# Patient Record
Sex: Female | Born: 1985 | Race: Black or African American | Hispanic: No | Marital: Single | Smoking: Never smoker
Health system: Southern US, Community
[De-identification: ages and names within clinical notes are randomized; demographics above are authoritative.]

## PROBLEM LIST (undated history)

## (undated) HISTORY — PX: DENTAL RESTORATION/EXTRACTION WITH X-RAY: SHX5796

---

## 2014-11-20 ENCOUNTER — Observation Stay: Payer: Self-pay | Admitting: Obstetrics & Gynecology

## 2014-11-25 ENCOUNTER — Observation Stay: Payer: Self-pay | Admitting: Obstetrics and Gynecology

## 2014-11-25 LAB — PLATELET COUNT: PLATELETS: 138 10*3/uL — AB (ref 150–440)

## 2014-12-03 ENCOUNTER — Inpatient Hospital Stay: Payer: Self-pay | Admitting: Obstetrics and Gynecology

## 2014-12-03 LAB — CBC WITH DIFFERENTIAL/PLATELET
Basophil #: 0 10*3/uL (ref 0.0–0.1)
Basophil %: 0.1 %
Eosinophil #: 0.1 10*3/uL (ref 0.0–0.7)
Eosinophil %: 1.4 %
HCT: 34 % — ABNORMAL LOW (ref 35.0–47.0)
HGB: 11.5 g/dL — ABNORMAL LOW (ref 12.0–16.0)
Lymphocyte #: 1.4 10*3/uL (ref 1.0–3.6)
Lymphocyte %: 16.6 %
MCH: 32.3 pg (ref 26.0–34.0)
MCHC: 34 g/dL (ref 32.0–36.0)
MCV: 95 fL (ref 80–100)
MONOS PCT: 7.5 %
Monocyte #: 0.6 x10 3/mm (ref 0.2–0.9)
Neutrophil #: 6.2 10*3/uL (ref 1.4–6.5)
Neutrophil %: 74.4 %
Platelet: 131 10*3/uL — ABNORMAL LOW (ref 150–440)
RBC: 3.57 10*6/uL — ABNORMAL LOW (ref 3.80–5.20)
RDW: 13.7 % (ref 11.5–14.5)
WBC: 8.3 10*3/uL (ref 3.6–11.0)

## 2014-12-05 LAB — RAPID URINE DRUG SCREEN, HOSP PERFORMED
Amphetamines, Ur Screen: NEGATIVE (ref ?–1000)
Barbiturates, Ur Screen: NEGATIVE (ref ?–200)
Benzodiazepine, Ur Scrn: NEGATIVE (ref ?–200)
CANNABINOID 50 NG, UR ~~LOC~~: NEGATIVE (ref ?–50)
COCAINE METABOLITE, UR ~~LOC~~: NEGATIVE (ref ?–300)
Opiate, Ur Screen: NEGATIVE (ref ?–300)

## 2014-12-06 LAB — HEMATOCRIT: HCT: 28.2 % — ABNORMAL LOW (ref 35.0–47.0)

## 2015-02-11 ENCOUNTER — Emergency Department: Payer: Self-pay | Admitting: Emergency Medicine

## 2015-05-07 NOTE — H&P (Signed)
L&D Evaluation:  History Expanded:  HPI 29 yo at term for Non-Stress Test.  estimated date of confinement either Dec 1 or Nov 17, based on differing information.  Patient seen at [facility] for transfer of care after moving here recently.   Gravida 2   Term 0   PreTerm 0   Abortion 1   Living 0   Patient's Medical History No Chronic Illness   Patient's Surgical History none   Medications Pre Natal Vitamins   Allergies NKDA   Social History none   Family History Non-Contributory   ROS:  ROS All systems were reviewed.  HEENT, CNS, GI, GU, Respiratory, CV, Renal and Musculoskeletal systems were found to be normal.   Exam:  Vital Signs stable   General no apparent distress   Mental Status clear   Abdomen gravid, non-tender   Estimated Fetal Weight Average for gestational age   Edema no edema   FHT normal rate with no decels   Ucx absent   Impression:  Impression reactive NST, Term vs Post Dates, Dates unclear.  Fetal Well-being Reassuring.   Plan:  Comments A NST procedure was performed with FHR monitoring and a normal baseline established, appropriate time of 20-40 minutes of evaluation, and accels >2 seen w 15x15 characteristics.  Results show a REACTIVE Non-Stress Test.   Electronic Signatures: Letitia LibraHarris, Hancel Ion Paul (MD)  (Signed [phone]-Nov-15 17:53)  Authored: L&D Evaluation   Last Updated: 24-Nov-15 17:53 by Letitia LibraHarris, Rondal Vandevelde Paul (MD)
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
## 2015-11-27 ENCOUNTER — Encounter: Payer: Self-pay | Admitting: Obstetrics and Gynecology

## 2015-11-28 ENCOUNTER — Encounter: Payer: Self-pay | Admitting: Obstetrics and Gynecology

## 2018-06-12 ENCOUNTER — Emergency Department: Payer: Medicaid Other

## 2018-06-12 ENCOUNTER — Emergency Department
Admission: EM | Admit: 2018-06-12 | Discharge: 2018-06-13 | Disposition: A | Discharge status: Home / Self Care | Payer: Medicaid Other | Attending: Emergency Medicine | Admitting: Emergency Medicine

## 2018-06-12 DIAGNOSIS — R042 Hemoptysis: Secondary | ICD-10-CM | POA: Insufficient documentation

## 2018-06-12 DIAGNOSIS — R05 Cough: Secondary | ICD-10-CM | POA: Insufficient documentation

## 2018-06-12 DIAGNOSIS — J40 Bronchitis, not specified as acute or chronic: Secondary | ICD-10-CM

## 2018-06-12 DIAGNOSIS — M7989 Other specified soft tissue disorders: Secondary | ICD-10-CM

## 2018-06-12 DIAGNOSIS — R059 Cough, unspecified: Secondary | ICD-10-CM

## 2018-06-12 DIAGNOSIS — R2242 Localized swelling, mass and lump, left lower limb: Secondary | ICD-10-CM | POA: Insufficient documentation

## 2018-06-12 LAB — COMPREHENSIVE METABOLIC PANEL
ALK PHOS: 90 U/L (ref 38–126)
ALT: 18 U/L (ref 14–54)
AST: 26 U/L (ref 15–41)
Albumin: 3.7 g/dL (ref 3.5–5.0)
Anion gap: 8 (ref 5–15)
BILIRUBIN TOTAL: 0.3 mg/dL (ref 0.3–1.2)
BUN: 7 mg/dL (ref 6–20)
CALCIUM: 8.6 mg/dL — AB (ref 8.9–10.3)
CO2: 21 mmol/L — ABNORMAL LOW (ref 22–32)
Chloride: 108 mmol/L (ref 101–111)
Creatinine, Ser: 0.73 mg/dL (ref 0.44–1.00)
GFR calc Af Amer: >60 mL/min (ref >60)
GFR calc non Af Amer: >60 mL/min (ref >60)
Glucose, Bld: 106 mg/dL — ABNORMAL HIGH (ref 65–99)
POTASSIUM: 3.6 mmol/L (ref 3.5–5.1)
Sodium: 137 mmol/L (ref 135–145)
TOTAL PROTEIN: 7.5 g/dL (ref 6.5–8.1)

## 2018-06-12 LAB — CBC WITH DIFFERENTIAL/PLATELET
BASOS ABS: 0 10*3/uL (ref 0–0.1)
Basophils Relative: 0 %
Eosinophils Absolute: 0.2 10*3/uL (ref 0–0.7)
Eosinophils Relative: 3 %
HEMATOCRIT: 33.2 % — AB (ref 35.0–47.0)
HEMOGLOBIN: 10.8 g/dL — AB (ref 12.0–16.0)
LYMPHS PCT: 32 %
Lymphs Abs: 1.8 10*3/uL (ref 1.0–3.6)
MCH: 27.2 pg (ref 26.0–34.0)
MCHC: 32.6 g/dL (ref 32.0–36.0)
MCV: 83.5 fL (ref 80.0–100.0)
MONOS PCT: 7 %
Monocytes Absolute: 0.4 10*3/uL (ref 0.2–0.9)
NEUTROS ABS: 3.1 10*3/uL (ref 1.4–6.5)
Neutrophils Relative %: 58 %
Platelets: 224 10*3/uL (ref 150–440)
RBC: 3.98 MIL/uL (ref 3.80–5.20)
RDW: 15.3 % — AB (ref 11.5–14.5)
WBC: 5.4 10*3/uL (ref 3.6–11.0)

## 2018-06-12 LAB — FIBRIN DERIVATIVES D-DIMER (ARMC ONLY): Fibrin derivatives D-dimer (ARMC): 991.5 ng/mL (FEU) — ABNORMAL HIGH (ref 0.00–499.00)

## 2018-06-12 MED ORDER — PREDNISONE 20 MG PO TABS: 60.0000 mg | ORAL_TABLET | Freq: Every day | ORAL | 0 refills | Status: AC

## 2018-06-12 MED ORDER — ALBUTEROL SULFATE HFA 108 (90 BASE) MCG/ACT IN AERS: 2.0000 | INHALATION_SPRAY | Freq: Four times a day (QID) | RESPIRATORY_TRACT | 2 refills | Status: AC | PRN

## 2018-06-12 MED ORDER — IOPAMIDOL (ISOVUE-370) INJECTION 76%
75.0000 mL | Freq: Once | INTRAVENOUS | Status: AC | PRN
  Administered 2018-06-12: 75 mL via INTRAVENOUS

## 2018-06-12 NOTE — ED Triage Notes (Signed)
Patient c/o hemoptysis and congestion. Patient's voice is hoarse.

## 2018-06-12 NOTE — ED Provider Notes (Signed)
[facility] Emergency Department Provider Note  ____________________________________________  Time seen: Approximately 10:42 PM  I have reviewed the triage vital signs and the nursing notes.   HISTORY  Chief Complaint Hemoptysis   HPI Molly Dunn is a 32 y.o. female no significant past medical history who presents for evaluation of cough.  Patient reports that her symptoms started a month ago.  Initially she had mild hemoptysis which she describes as blood streaking in her sputum.  That happened one time a month ago.  She saw a doctor at that time who told her was probably allergies.  She continues to have persistent coughing which is productive of yellow sputum.  No further episodes of hemoptysis.  She denies chest pain or shortness of breath.  She reports sometimes the coughing so bad that she has a hard time catching her breath.  She denies any personal or family history of blood clots, recent travel immobilization, exogenous hormones.  She does notice intermittent left lower extremity pain especially when she stands for several hours at work.  No pain at this time.  She denies any fever or chills.  No prior history of smoking.  PMH None - reviewed  Past Surgical History:  Procedure Laterality Date  . DENTAL RESTORATION/EXTRACTION WITH X-RAY      Prior to Admission medications   Medication Sig Start Date End Date Taking? Authorizing Provider  predniSONE (DELTASONE) 20 MG tablet Take 3 tablets (60 mg total) by mouth daily for 5 days. 06/12/18 06/17/18  Nita Sickle, MD    Allergies Patient has no known allergies.  FH No f/h of DVT/ PE  Social History Social History   Tobacco Use  . Smoking status: Never Smoker  . Smokeless tobacco: Never Used  Substance Use Topics  . Alcohol use: Not Currently  . Drug use: Not on file    Review of Systems  Constitutional: Negative for fever. Eyes: Negative for visual changes. ENT: Negative for  sore throat. Neck: No neck pain  Cardiovascular: Negative for chest pain. Respiratory: Negative for shortness of breath. + cough and hemoptysis Gastrointestinal: Negative for abdominal pain, vomiting or diarrhea. Genitourinary: Negative for dysuria. Musculoskeletal: Negative for back pain. + Left leg pain Skin: Negative for rash. Neurological: Negative for headaches, weakness or numbness. Psych: No SI or HI  ____________________________________________   PHYSICAL EXAM:  VITAL SIGNS: ED Triage Vitals  Enc Vitals Group     BP 06/12/18 1952 122/67     Pulse Rate 06/12/18 1952 (!) 107     Resp 06/12/18 1952 19     Temp 06/12/18 1952 98.7 F (37.1 C)     Temp Source 06/12/18 1952 Oral     SpO2 06/12/18 1952 100 %     Weight 06/12/18 1951 210 lb 5.1 oz (95.4 kg)     Height --      Head Circumference --      Peak Flow --      Pain Score 06/12/18 1952 0     Pain Loc --      Pain Edu? --      Excl. in GC? --     Constitutional: Alert and oriented. Well appearing and in no apparent distress. HEENT:      Head: Normocephalic and atraumatic.         Eyes: Conjunctivae are normal. Sclera is non-icteric.       Mouth/Throat: Mucous membranes are moist.       Neck: Supple with no signs  of meningismus. Cardiovascular: Tachycardic with regular. No murmurs, gallops, or rubs. 2+ symmetrical distal pulses are present in all extremities. No JVD. Respiratory: Normal respiratory effort. Lungs are clear to auscultation bilaterally. No wheezes, crackles, or rhonchi.  Gastrointestinal: Soft, non tender, and non distended with positive bowel sounds. No rebound or guarding. Musculoskeletal: Asymmetric swelling of the left lower extremity, no pitting edema, no erythema or warmth neurologic: Normal speech and language. Face is symmetric. Moving all extremities. No gross focal neurologic deficits are appreciated. Skin: Skin is warm, dry and intact. No rash noted. Psychiatric: Mood and affect are  normal. Speech and behavior are normal.  ____________________________________________   LABS (all labs ordered are listed, but only abnormal results are displayed)  Labs Reviewed  CBC WITH DIFFERENTIAL/PLATELET - Abnormal; Notable for the following components:      Result Value   Hemoglobin 10.8 (*)    HCT 33.2 (*)    RDW 15.3 (*)    All other components within normal limits  COMPREHENSIVE METABOLIC PANEL - Abnormal; Notable for the following components:   CO2 21 (*)    Glucose, Bld 106 (*)    Calcium 8.6 (*)    All other components within normal limits  FIBRIN DERIVATIVES D-DIMER ([facility] ONLY) - Abnormal; Notable for the following components:   Fibrin derivatives D-dimer [facility]) 991.50 (*)    All other components within normal limits   ____________________________________________  EKG  none  ____________________________________________  RADIOLOGY  I have personally reviewed the images performed during this visit and I agree with the Radiologist's read.   Interpretation by Radiologist:  Dg Chest 2 View  Result Date: 06/12/2018 CLINICAL DATA:  Hemoptysis and congestion.  Cough for a while. EXAM: CHEST - 2 VIEW COMPARISON:  None. FINDINGS: Shallow inspiration. Vascular crowding in the right lung base. Heart size and pulmonary vascularity are normal. Lungs are clear and expanded. No blunting of costophrenic angles. No pneumothorax. Mediastinal contours appear intact. IMPRESSION: Shallow inspiration.  No evidence of active pulmonary disease. Electronically Signed   By: Burman Nieves M.D.   On: 06/12/2018 21:29     ____________________________________________   PROCEDURES  Procedure(s) performed: None Procedures Critical Care performed:  None ____________________________________________   INITIAL IMPRESSION / ASSESSMENT AND PLAN / ED COURSE  32 y.o. female no significant past medical history who presents for evaluation of cough x 30 days.  One episode of hemoptysis.   Patient does have asymmetric swelling of her left lower extremity with no evidence of cellulitis or pitting edema.  Therefore a d-dimer was sent which is positive.  Venous ultrasound of the left lower extremity and CT angiogram of the chest are pending to rule out PE.  Chest x-ray shows no evidence of pneumonia.  Patient with no prior history of smoking. If CTA negative will treat for bronchitis with albuterol and prednisone.    _________________________ 11:02 PM on 06/12/2018 -----------------------------------------  CTA and doppler US pending. Care transferred to Dr. Zenda Alpers   As part of my medical decision making, I reviewed the following data within the electronic MEDICAL RECORD NUMBER Nursing notes reviewed and incorporated, Labs reviewed , Radiograph reviewed , Notes from prior ED visits and [place] Controlled Substance Database    Pertinent labs & imaging results that were available during my care of the patient were reviewed by me and considered in my medical decision making (see chart for details).    ____________________________________________   FINAL CLINICAL IMPRESSION(S) / ED DIAGNOSES  Final diagnoses:  Cough  Hemoptysis  Left leg swelling  Bronchitis      NEW MEDICATIONS STARTED DURING THIS VISIT:  ED Discharge Orders        Ordered    predniSONE (DELTASONE) 20 MG tablet  Daily     06/12/18 2303       Note:  This document was prepared using Dragon voice recognition software and may include unintentional dictation errors.    Nita SickleVeronese, [place], MD 06/12/18 [phone]

## 2018-06-12 NOTE — ED Notes (Addendum)
Patient transported to Radiology (US, CT) for scans

## 2018-06-13 NOTE — ED Provider Notes (Signed)
-----------------------------------------   12:48 AM on 06/13/2018 -----------------------------------------   Blood pressure 100/85, pulse 90, temperature 98.7 F (37.1 C), temperature source Oral, resp. rate 16, weight 95.4 kg (210 lb 5.1 oz), last menstrual period 05/26/2018, SpO2 100 %.  Assuming care from Dr. Don PerkingVeronese.  In short, Molly Dunn is a 32 y.o. female with a chief complaint of Hemoptysis .  Refer to the original H&P for additional details.  The current plan of care is to follow up the result of the CT angio chest and lower extremity ultrasound.   Left lower extremity venous Doppler ultrasound: No evidence of deep venous thrombosis in the left lower extremity  CT angios chest PE: No pulmonary embolism, no acute pulmonary parenchymal findings.   The patient does not have a PE or DVT.  She will be discharged home with a diagnosis of bronchitis.      Rebecka ApleyWebster, Molly P, MD 06/13/18 [phone]

## 2018-06-13 NOTE — Discharge Instructions (Addendum)
Please follow-up with the acute care clinic for further evaluation of your symptoms.  Please return with any worsening condition or any other concerns.

## 2019-02-01 ENCOUNTER — Other Ambulatory Visit: Payer: Self-pay

## 2019-02-01 ENCOUNTER — Encounter: Payer: Self-pay | Admitting: Emergency Medicine

## 2019-02-01 ENCOUNTER — Ambulatory Visit
Admission: EM | Admit: 2019-02-01 | Discharge: 2019-02-01 | Disposition: A | Discharge status: Home / Self Care | Payer: Medicaid Other | Attending: Family Medicine | Admitting: Family Medicine

## 2019-02-01 DIAGNOSIS — D508 Other iron deficiency anemias: Secondary | ICD-10-CM | POA: Diagnosis present

## 2019-02-01 LAB — BASIC METABOLIC PANEL
ANION GAP: 8 (ref 5–15)
BUN: 14 mg/dL (ref 6–20)
CHLORIDE: 108 mmol/L (ref 98–111)
CO2: 21 mmol/L — ABNORMAL LOW (ref 22–32)
Calcium: 8.9 mg/dL (ref 8.9–10.3)
Creatinine, Ser: 0.66 mg/dL (ref 0.44–1.00)
GFR calc Af Amer: >60 mL/min (ref >60)
Glucose, Bld: 93 mg/dL (ref 70–99)
POTASSIUM: 3.7 mmol/L (ref 3.5–5.1)
SODIUM: 137 mmol/L (ref 135–145)

## 2019-02-01 LAB — CBC WITH DIFFERENTIAL/PLATELET
ABS IMMATURE GRANULOCYTES: 0.01 10*3/uL (ref 0.00–0.07)
Basophils Absolute: 0 10*3/uL (ref 0.0–0.1)
Basophils Relative: 0 %
Eosinophils Absolute: 0.1 10*3/uL (ref 0.0–0.5)
Eosinophils Relative: 1 %
HCT: 36 % (ref 36.0–46.0)
HEMOGLOBIN: 11.2 g/dL — AB (ref 12.0–15.0)
IMMATURE GRANULOCYTES: 0 %
LYMPHS PCT: 39 %
Lymphs Abs: 2.2 10*3/uL (ref 0.7–4.0)
MCH: 26.3 pg (ref 26.0–34.0)
MCHC: 31.1 g/dL (ref 30.0–36.0)
MCV: 84.5 fL (ref 80.0–100.0)
MONOS PCT: 6 %
Monocytes Absolute: 0.3 10*3/uL (ref 0.1–1.0)
NEUTROS ABS: 2.9 10*3/uL (ref 1.7–7.7)
NEUTROS PCT: 54 %
PLATELETS: 247 10*3/uL (ref 150–400)
RBC: 4.26 MIL/uL (ref 3.87–5.11)
RDW: 15.7 % — ABNORMAL HIGH (ref 11.5–15.5)
WBC: 5.5 10*3/uL (ref 4.0–10.5)
nRBC: 0 % (ref 0.0–0.2)

## 2019-02-01 MED ORDER — FERROUS SULFATE 325 (65 FE) MG PO TABS: 325.0000 mg | ORAL_TABLET | Freq: Two times a day (BID) | ORAL | 0 refills | Status: AC

## 2019-02-01 NOTE — ED Triage Notes (Signed)
Patient states she has been feeling dizzy since Sunday.  Patient has tried rest but is still feeling dizzy

## 2019-02-01 NOTE — Discharge Instructions (Signed)
Follow up with primary care provider 

## 2019-04-29 NOTE — ED Provider Notes (Signed)
[facility]    CSN: [id number] Arrival date & time: 02/01/19  1158     History   Chief Complaint Chief Complaint  Patient presents with  . Dizziness    HPI Molly Dunn is a 33 y.o. female.   33 yo female with a c/o intermittent dizziness for the past 4 days. Denies any fevers, chills, syncope, chest pains, palpitations, numbness/tingling, weakness.  The history is provided by the patient.  Dizziness    History reviewed. No pertinent past medical history.  There are no active problems to display for this patient.   Past Surgical History:  Procedure Laterality Date  . DENTAL RESTORATION/EXTRACTION WITH X-RAY      OB History   No obstetric history on file.      Home Medications    Prior to Admission medications   Medication Sig Start Date End Date Taking? Authorizing Provider  albuterol (PROVENTIL HFA;VENTOLIN HFA) 108 (90 Base) MCG/ACT inhaler Inhale 2 puffs into the lungs every 6 (six) hours as needed for wheezing or shortness of breath. 06/12/18   Nita Sickle, MD  ferrous sulfate 325 (65 FE) MG tablet Take 1 tablet (325 mg total) by mouth 2 (two) times daily with a meal. 02/01/19   Payton Mccallum, MD    Family History Family History  Family history unknown: Yes    Social History Social History   Tobacco Use  . Smoking status: Never Smoker  . Smokeless tobacco: Never Used  Substance Use Topics  . Alcohol use: Not Currently  . Drug use: Not on file     Allergies   Patient has no known allergies.   Review of Systems Review of Systems  Neurological: Positive for dizziness.     Physical Exam Triage Vital Signs ED Triage Vitals  Enc Vitals Group     BP 02/01/19 1221 109/74     Pulse Rate 02/01/19 1221 72     Resp 02/01/19 1221 16     Temp 02/01/19 1221 98.1 F (36.7 C)     Temp src --      SpO2 02/01/19 1221 100 %     Weight 02/01/19 1215 212 lb (96.2 kg)     Height 02/01/19 1215 5\' 6"  (1.676 m)     Head  Circumference --      Peak Flow --      Pain Score 02/01/19 1215 0     Pain Loc --      Pain Edu? --      Excl. in GC? --    No data found.  Updated Vital Signs BP 109/74   Pulse 72   Temp 98.1 F (36.7 C)   Resp 16   Ht 5\' 6"  (1.676 m)   Wt 96.2 kg   LMP 01/12/2019 (Within Weeks)   SpO2 100%   BMI 34.22 kg/m   Visual Acuity Right Eye Distance:   Left Eye Distance:   Bilateral Distance:    Right Eye Near:   Left Eye Near:    Bilateral Near:     Physical Exam Vitals signs reviewed.  Constitutional:      General: She is not in acute distress.    Appearance: She is not toxic-appearing or diaphoretic.  Cardiovascular:     Rate and Rhythm: Normal rate.     Pulses: Normal pulses.     Heart sounds: Normal heart sounds.  Pulmonary:     Effort: Pulmonary effort is normal. No respiratory distress.     Breath  sounds: Normal breath sounds. No stridor. No wheezing, rhonchi or rales.  Neurological:     Mental Status: She is alert.      UC Treatments / Results  Labs (all labs ordered are listed, but only abnormal results are displayed) Labs Reviewed  CBC WITH DIFFERENTIAL/PLATELET - Abnormal; Notable for the following components:      Result Value   Hemoglobin 11.2 (*)    RDW 15.7 (*)    All other components within normal limits  BASIC METABOLIC PANEL - Abnormal; Notable for the following components:   CO2 21 (*)    All other components within normal limits    EKG None  Radiology No results found.  Procedures Procedures (including critical care time)  Medications Ordered in UC Medications - No data to display  Initial Impression / Assessment and Plan / UC Course  I have reviewed the triage vital signs and the nursing notes.  Pertinent labs & imaging results that were available during my care of the patient were reviewed by me and considered in my medical decision making (see chart for details).      Final Clinical Impressions(s) / UC Diagnoses    Final diagnoses:  Other iron deficiency anemia     Discharge Instructions     Follow up with primary care provider    ED Prescriptions    Medication Sig Dispense Auth. Provider   ferrous sulfate 325 (65 FE) MG tablet Take 1 tablet (325 mg total) by mouth 2 (two) times daily with a meal. 30 tablet Payton Mccallumonty, Abby Tucholski, MD     1. Lab results and diagnosis reviewed with patient 2. rx as per orders above; reviewed possible side effects, interactions, risks and benefits  3.  Follow up with pcp  4. Follow-up prn  Controlled Substance Prescriptions [place] Controlled Substance Registry consulted? Not Applicable   Payton Mccallumonty, Whitaker Holderman, MD 04/29/19 1224

## 2019-08-04 IMAGING — US US EXTREM LOW VENOUS*L*
1 series · 13 of 24 positions shown · non-contrast
Comparison: None.

CLINICAL DATA: Asymmetric leg swelling.



[Series 1: us extrem low venous*left* · 0.09mm/px · 13 of 40 slices shown]
[im 1/40]
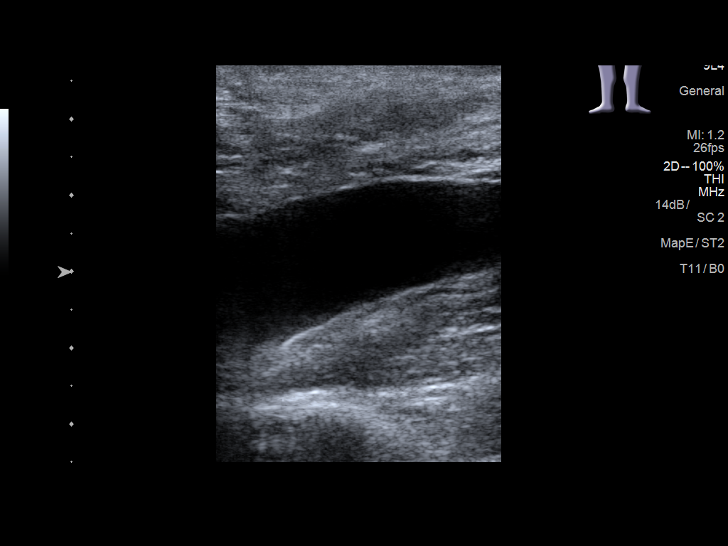
[im 4/40]
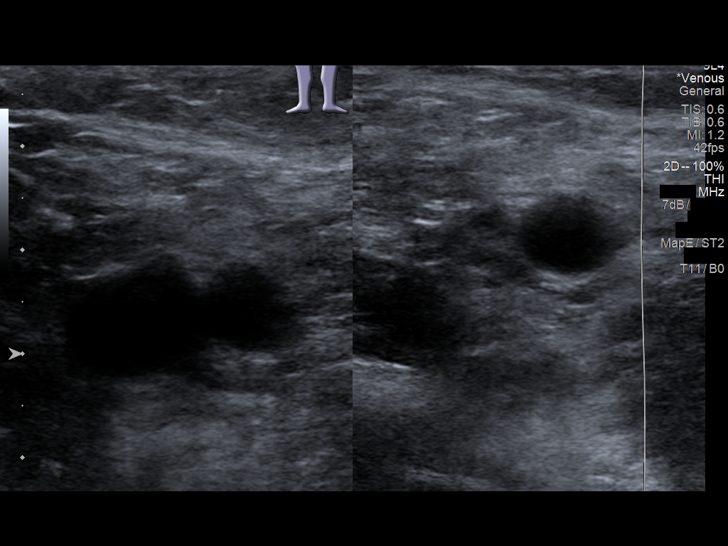
[im 7/40]
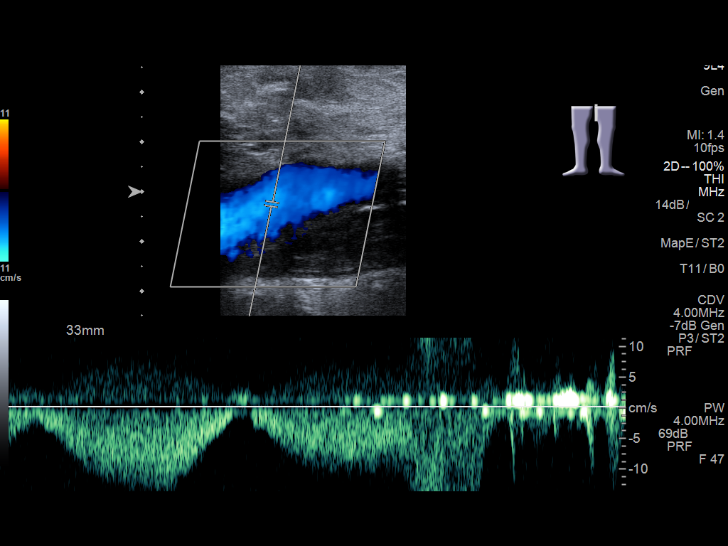
[im 11/40]
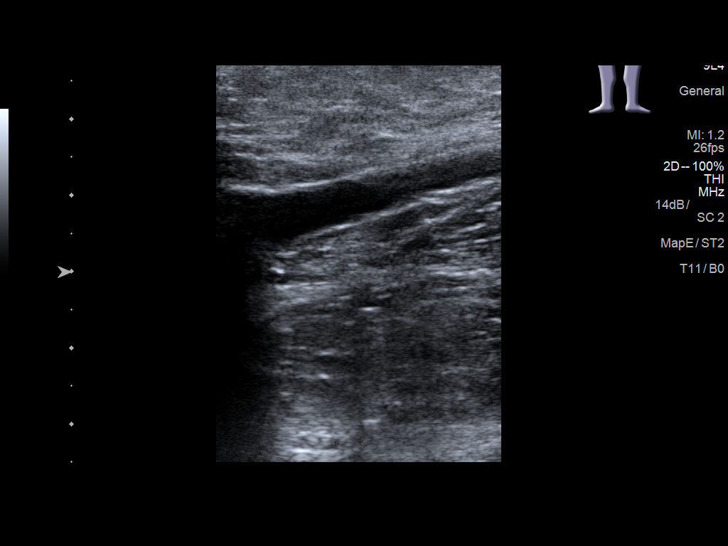
[im 14/40]
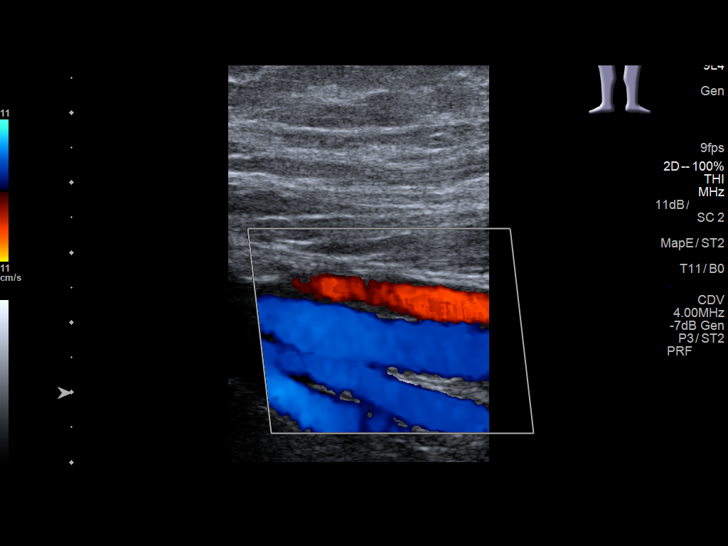
[im 17/40]
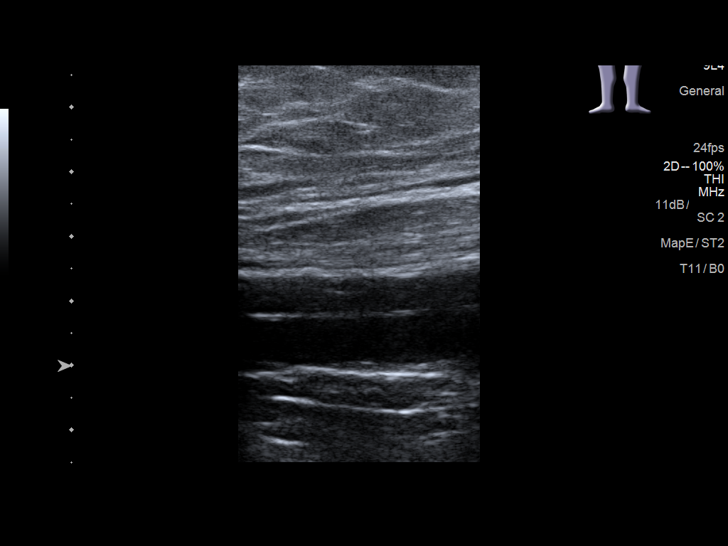
[im 21/40]
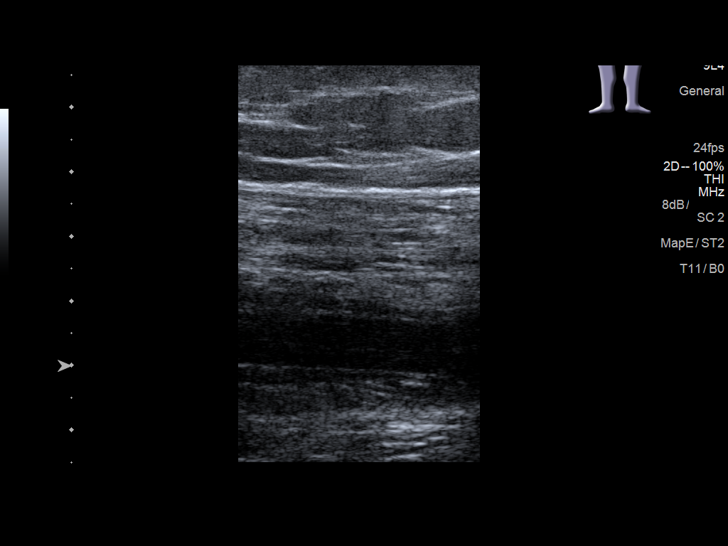
[im 23/40]
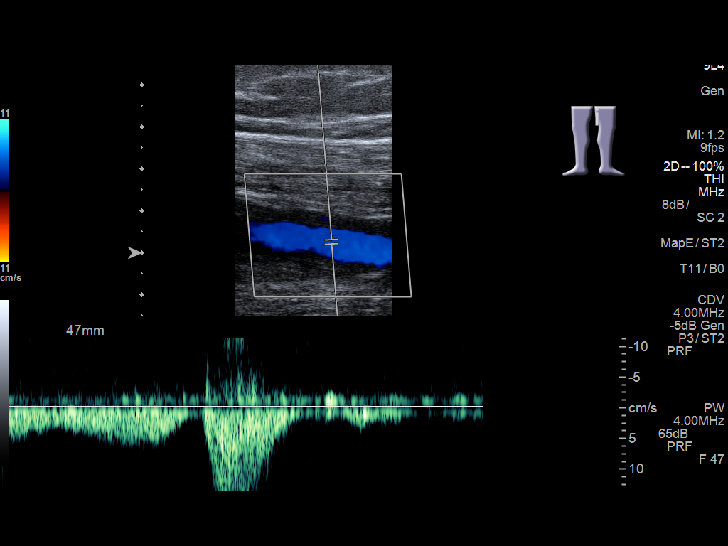
[im 26/40]
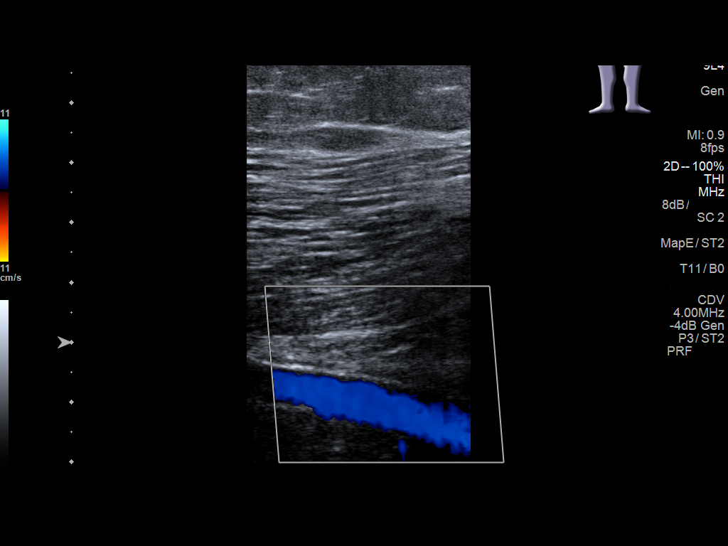
[im 29/40]
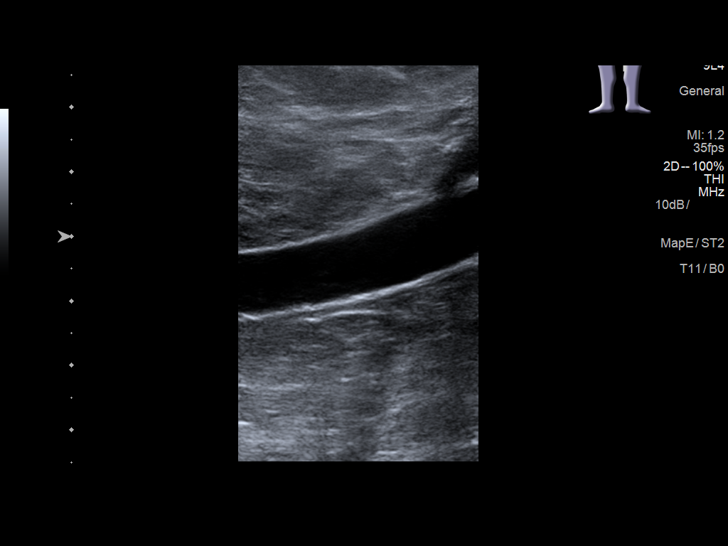
[im 33/40]
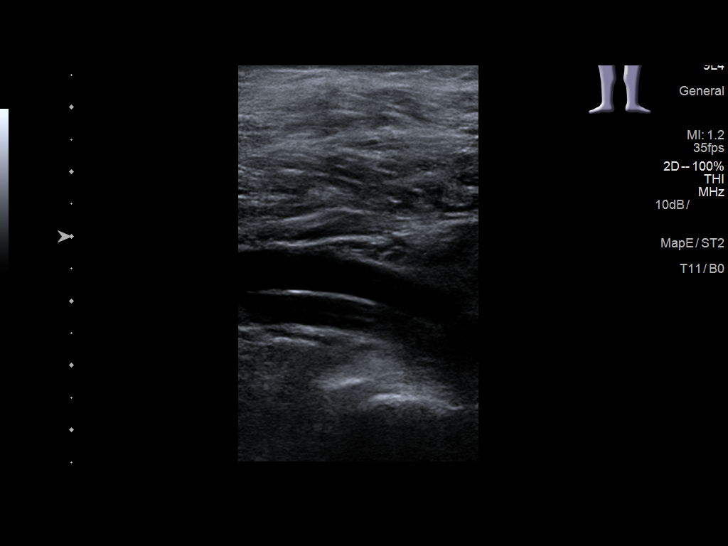
[im 36/40]
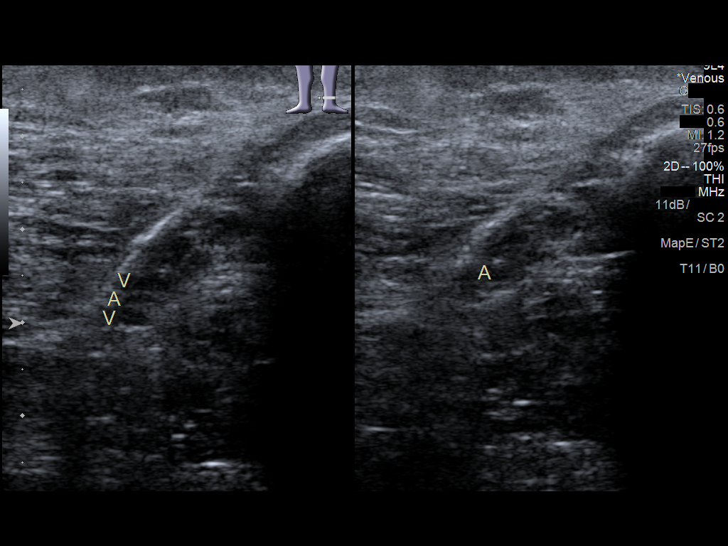
[im 40/40]
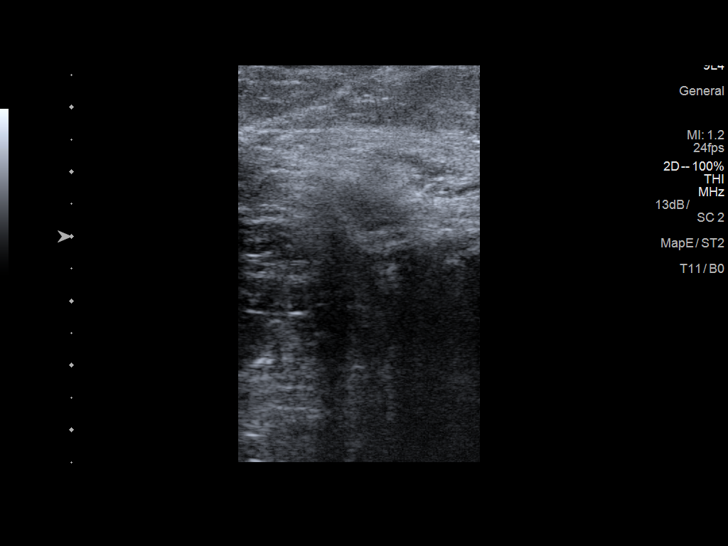

[13 of 24 positions shown; findings below may reference images not displayed]

FINDINGS: Contralateral Common Femoral Vein: Respiratory phasicity is normal
and symmetric with the symptomatic side. No evidence of thrombus.
Normal compressibility.

Common Femoral Vein: No evidence of thrombus. Normal
compressibility, respiratory phasicity and response to augmentation.

Saphenofemoral Junction: No evidence of thrombus. Normal
compressibility and flow on color Doppler imaging.

Profunda Femoral Vein: No evidence of thrombus. Normal
compressibility and flow on color Doppler imaging.

Femoral Vein: No evidence of thrombus. Normal compressibility,
respiratory phasicity and response to augmentation.

Popliteal Vein: No evidence of thrombus. Normal compressibility,
respiratory phasicity and response to augmentation.

Calf Veins: No evidence of thrombus. Normal compressibility and flow
on color Doppler imaging.

Superficial Great Saphenous Vein: No evidence of thrombus. Normal
compressibility.

Venous Reflux:  None.

Other Findings:  None.
IMPRESSION: No evidence of deep venous thrombosis in the left lower extremity.

## 2021-06-22 ENCOUNTER — Emergency Department
Admission: EM | Admit: 2021-06-22 | Discharge: 2021-06-22 | Disposition: A | Discharge status: Home / Self Care | Payer: Medicaid Other | Attending: Emergency Medicine | Admitting: Emergency Medicine

## 2021-06-22 ENCOUNTER — Other Ambulatory Visit: Payer: Self-pay

## 2021-06-22 DIAGNOSIS — R42 Dizziness and giddiness: Secondary | ICD-10-CM | POA: Insufficient documentation

## 2021-06-22 LAB — CBC WITH DIFFERENTIAL/PLATELET
Abs Immature Granulocytes: 0.01 10*3/uL (ref 0.00–0.07)
Basophils Absolute: 0 10*3/uL (ref 0.0–0.1)
Basophils Relative: 0 %
Eosinophils Absolute: 0.2 10*3/uL (ref 0.0–0.5)
Eosinophils Relative: 3 %
HCT: 35.9 % — ABNORMAL LOW (ref 36.0–46.0)
Hemoglobin: 12 g/dL (ref 12.0–15.0)
Immature Granulocytes: 0 %
Lymphocytes Relative: 40 %
Lymphs Abs: 2.2 10*3/uL (ref 0.7–4.0)
MCH: 31.2 pg (ref 26.0–34.0)
MCHC: 33.4 g/dL (ref 30.0–36.0)
MCV: 93.2 fL (ref 80.0–100.0)
Monocytes Absolute: 0.4 10*3/uL (ref 0.1–1.0)
Monocytes Relative: 6 %
Neutro Abs: 2.7 10*3/uL (ref 1.7–7.7)
Neutrophils Relative %: 51 %
Platelets: 178 10*3/uL (ref 150–400)
RBC: 3.85 MIL/uL — ABNORMAL LOW (ref 3.87–5.11)
RDW: 12.5 % (ref 11.5–15.5)
WBC: 5.4 10*3/uL (ref 4.0–10.5)
nRBC: 0 % (ref 0.0–0.2)

## 2021-06-22 LAB — COMPREHENSIVE METABOLIC PANEL
ALT: 15 U/L (ref 0–44)
AST: 20 U/L (ref 15–41)
Albumin: 3.8 g/dL (ref 3.5–5.0)
Alkaline Phosphatase: 55 U/L (ref 38–126)
Anion gap: 5 (ref 5–15)
BUN: 10 mg/dL (ref 6–20)
CO2: 23 mmol/L (ref 22–32)
Calcium: 8.9 mg/dL (ref 8.9–10.3)
Chloride: 108 mmol/L (ref 98–111)
Creatinine, Ser: 0.73 mg/dL (ref 0.44–1.00)
GFR, Estimated: >60 mL/min (ref >60)
Glucose, Bld: 100 mg/dL — ABNORMAL HIGH (ref 70–99)
Potassium: 3.7 mmol/L (ref 3.5–5.1)
Sodium: 136 mmol/L (ref 135–145)
Total Bilirubin: 0.5 mg/dL (ref 0.3–1.2)
Total Protein: 7.4 g/dL (ref 6.5–8.1)

## 2021-06-22 LAB — URINALYSIS, COMPLETE (UACMP) WITH MICROSCOPIC
Bacteria, UA: NONE SEEN
Bilirubin Urine: NEGATIVE
Glucose, UA: NEGATIVE mg/dL
Hgb urine dipstick: NEGATIVE
Ketones, ur: NEGATIVE mg/dL
Leukocytes,Ua: NEGATIVE
Nitrite: NEGATIVE
Protein, ur: NEGATIVE mg/dL
Specific Gravity, Urine: 1.023 (ref 1.005–1.030)
pH: 6 (ref 5.0–8.0)

## 2021-06-22 LAB — PREGNANCY, URINE: Preg Test, Ur: NEGATIVE

## 2021-06-22 NOTE — ED Provider Notes (Signed)
[facility] Emergency Department Provider Note   ____________________________________________   Event Date/Time   First MD Initiated Contact with Patient 06/22/21 2258     (approximate)  I have reviewed the triage vital signs and the nursing notes.   HISTORY  Chief Complaint Dizziness    HPI Molly Dunn is a 35 y.o. female with no significant past medical history presents to the ED complaining of dizziness.  Patient reports that she has been intermittently feeling dizzy and unsteady on her feet for the past 3 days.  Symptoms seem most severe on Friday, when she first noticed them, have eased up since then but are still present.  She primarily feels dizzy when she is up and walking, feels like she is unsteady on her feet but denies any lightheadedness or near syncope.  She has not had any vision changes, speech changes, numbness, or weakness.  She has not had any fevers, cough, chest pain, shortness of breath, vomiting, or diarrhea.  She is concerned about her blood sugar level as well as her hemoglobin as she has been told she is prediabetic and had low iron in the past.        No past medical history on file.  There are no problems to display for this patient.   Past Surgical History:  Procedure Laterality Date   DENTAL RESTORATION/EXTRACTION WITH X-RAY      Prior to Admission medications   Medication Sig Start Date End Date Taking? Authorizing Provider  albuterol (PROVENTIL HFA;VENTOLIN HFA) 108 (90 Base) MCG/ACT inhaler Inhale 2 puffs into the lungs every 6 (six) hours as needed for wheezing or shortness of breath. 06/12/18   Nita Sickle, MD  ferrous sulfate 325 (65 FE) MG tablet Take 1 tablet (325 mg total) by mouth 2 (two) times daily with a meal. 02/01/19   Payton Mccallum, MD    Allergies Patient has no known allergies.  Family History  Family history unknown: Yes    Social History Social History   Tobacco Use   Smoking  status: Never   Smokeless tobacco: Never  Substance Use Topics   Alcohol use: Not Currently    Review of Systems  Constitutional: No fever/chills.  Positive for dizziness. Eyes: No visual changes. ENT: No sore throat. Cardiovascular: Denies chest pain. Respiratory: Denies shortness of breath. Gastrointestinal: No abdominal pain.  No nausea, no vomiting.  No diarrhea.  No constipation. Genitourinary: Negative for dysuria. Musculoskeletal: Negative for back pain. Skin: Negative for rash. Neurological: Negative for headaches, focal weakness or numbness.  ____________________________________________   PHYSICAL EXAM:  VITAL SIGNS: ED Triage Vitals [06/22/21 1940]  Enc Vitals Group     BP 118/79     Pulse Rate 80     Resp 16     Temp 97.8 F (36.6 C)     Temp Source Oral     SpO2 100 %     Weight 219 lb (99.3 kg)     Height 5\' 6"  (1.676 m)     Head Circumference      Peak Flow      Pain Score      Pain Loc      Pain Edu?      Excl. in GC?     Constitutional: Alert and oriented. Eyes: Conjunctivae are normal. Head: Atraumatic. Nose: No congestion/rhinnorhea. Mouth/Throat: Mucous membranes are moist. Neck: Normal ROM Cardiovascular: Normal rate, regular rhythm. Grossly normal heart sounds.  2+ radial pulses bilaterally. Respiratory: Normal respiratory effort.  No retractions. Lungs CTAB. Gastrointestinal: Soft and nontender. No distention. Genitourinary: deferred Musculoskeletal: No lower extremity tenderness nor edema. Neurologic:  Normal speech and language. No gross focal neurologic deficits are appreciated. Skin:  Skin is warm, dry and intact. No rash noted. Psychiatric: Mood and affect are normal. Speech and behavior are normal.  ____________________________________________   LABS (all labs ordered are listed, but only abnormal results are displayed)  Labs Reviewed  CBC WITH DIFFERENTIAL/PLATELET - Abnormal; Notable for the following components:       Result Value   RBC 3.85 (*)    HCT 35.9 (*)    All other components within normal limits  COMPREHENSIVE METABOLIC PANEL - Abnormal; Notable for the following components:   Glucose, Bld 100 (*)    All other components within normal limits  URINALYSIS, COMPLETE (UACMP) WITH MICROSCOPIC - Abnormal; Notable for the following components:   Color, Urine YELLOW (*)    APPearance CLEAR (*)    All other components within normal limits  PREGNANCY, URINE  POC URINE PREG, ED   ____________________________________________  EKG  ED ECG REPORT I, Chesley Noon, the attending physician, personally viewed and interpreted this ECG.   Date: 06/22/2021  EKG Time: 19:46  Rate: 81  Rhythm: normal EKG, normal sinus rhythm, unchanged from previous tracings  Axis: Normal  Intervals:none  ST&T Change: None   PROCEDURES  Procedure(s) performed (including Critical Care):  Procedures   ____________________________________________   INITIAL IMPRESSION / ASSESSMENT AND PLAN / ED COURSE      35 year old female with no significant past medical history presents to the ED with intermittent dizziness for the past 3 days that she describes as an unsteadiness on her feet while she is walking.  She denies any numbness or weakness and has no focal neurologic deficits on exam, low suspicion for stroke.  EKG shows no evidence of arrhythmia or ischemia, doubt cardiac etiology.  Labs are also reassuring, no anemia or electrolyte abnormality noted.  Pregnancy testing is negative and UA shows no signs of infection.  Patient is appropriate for discharge home with PCP follow-up, was counseled to return to the ED for new or worsening symptoms.  Patient agrees with plan.      ____________________________________________   FINAL CLINICAL IMPRESSION(S) / ED DIAGNOSES  Final diagnoses:  Dizziness     ED Discharge Orders     None        Note:  This document was prepared using Dragon voice recognition  software and may include unintentional dictation errors.    Chesley Noon, MD 06/22/21 [phone]

## 2021-06-22 NOTE — ED Triage Notes (Signed)
Pt c/o dizziness since Friday, states worse on Friday. Pt states has felt dizzy before. Pt A&O x4. Pt states in the past her iron levels were low.

## 2021-06-22 NOTE — ED Provider Notes (Signed)
Emergency Medicine Provider Triage Evaluation Note  Molly Dunn , a 35 y.o. female  was evaluated in triage.  Pt complains of dizziness intermittent since Friday. Denies any other associated symptoms of chest pain, shortness of breath, abdominal pain, lightheadedness or feeling presyncopal. Patient reports hx of similar when iron was low in the past.  Review of Systems  Positive: dizziness Negative: Chest pain, shortness of breath, fever  Physical Exam  BP 118/79   Pulse 80   Temp 97.8 F (36.6 C) (Oral)   Resp 16   Ht 5\' 6"  (1.676 m)   Wt 99.3 kg   SpO2 100%   BMI 35.35 kg/m  Gen:   Awake, no distress  Resp:  Normal effort  MSK:   Moves extremities without difficulty  Other:    Medical Decision Making  Medically screening exam initiated at 7:45 PM.  Appropriate orders placed.  Ngoc Detjen was informed that the remainder of the evaluation will be completed by another provider, this initial triage assessment does not replace that evaluation, and the importance of remaining in the ED until their evaluation is complete.     Faylene Million, PA 06/22/21 1947    06/24/21, MD 06/22/21 [phone]

## 2021-06-23 LAB — POC URINE PREG, ED: Preg Test, Ur: NEGATIVE

## 2022-02-13 ENCOUNTER — Ambulatory Visit (INDEPENDENT_AMBULATORY_CARE_PROVIDER_SITE_OTHER): Payer: Medicaid Other | Admitting: Cardiovascular Disease

## 2022-02-13 ENCOUNTER — Other Ambulatory Visit: Payer: Self-pay

## 2022-02-13 ENCOUNTER — Encounter: Payer: Self-pay | Admitting: Cardiovascular Disease

## 2022-02-13 VITALS — BP 110/80 | HR 101 | Ht 67.0 in | Wt 211.4 lb

## 2022-02-13 DIAGNOSIS — Z6833 Body mass index (BMI) 33.0-33.9, adult: Secondary | ICD-10-CM | POA: Diagnosis not present

## 2022-02-13 DIAGNOSIS — R42 Dizziness and giddiness: Secondary | ICD-10-CM

## 2022-02-13 DIAGNOSIS — R7309 Other abnormal glucose: Secondary | ICD-10-CM

## 2022-02-13 DIAGNOSIS — E6609 Other obesity due to excess calories: Secondary | ICD-10-CM | POA: Diagnosis not present

## 2022-02-13 NOTE — Patient Instructions (Addendum)
Ear nose throat: Dr. Burnard Leigh [place] [phone] [place] [phone]   Medication Instructions:  No changes  If you need a refill on your cardiac medications before your next appointment, please call your pharmacy.   Lab work: No new labs needed  Testing/Procedures: No new testing needed  Follow-Up: At [facility], you and your health needs are our priority.  As part of our continuing mission to provide you with exceptional heart care, we have created designated Provider Care Teams.  These Care Teams include your primary Cardiologist (physician) and Advanced Practice Providers (APPs -  Physician Assistants and Nurse Practitioners) who all work together to provide you with the care you need, when you need it.  You will need a follow up appointment as needed  Providers on your designated Care Team:   Nicolasa Ducking, NP Eula Listen, PA-C Cadence Fransico Michael, [place]  COVID-19 Vaccine Information can be found at: [email or web address] For questions related to vaccine distribution or appointments, please email [email or web address] or call [phone].

## 2022-02-13 NOTE — Progress Notes (Signed)
Cardiology Office Note  Date:  02/13/2022   ID:  Molly Dunn, DOB February 13, 1986, MRN [id number]  PCP:  Franciso Bend, NP   Chief Complaint  Patient presents with   New Patient (Initial Visit)    Self referral for evaluation of leaking valve by a recent Echo with [facility] with Molly Agent, NP. Medications reviewed by the patient verbally. Patient c/o dizziness comes and goes.     HPI:  [name] is a 36 year old woman with past medical history of " Leaky valve" Who presents for follow-up of valvular heart disease per the patient on prior echo  Mother of 33-year-old boy In general was in good health Non-smoker, no diabetes, told her cholesterol is well controlled  Long discussion concerning recent events Reporting since summer 2022 she has had dizziness On further discussion of her symptoms, reports that if she moves her head the room seems to spin Symptoms are better on meclizine Denies dizziness on standing  No chest pain or shortness of breath  For her dizziness primary care did echocardiogram, results have been requested ,they are closed today Was told the valve issue was mild but wanted to get checked out  EKG personally reviewed by myself on todays visit Sinus tachycardia rate 101 bpm no significant ST-T wave changes    PMH:   has no past medical history on file.  PSH:    Past Surgical History:  Procedure Laterality Date   DENTAL RESTORATION/EXTRACTION WITH X-RAY      Current Outpatient Medications  Medication Sig Dispense Refill   ferrous sulfate 325 (65 FE) MG tablet Take 1 tablet (325 mg total) by mouth 2 (two) times daily with a meal. 30 tablet 0   meclizine (ANTIVERT) 25 MG tablet Take 25 mg by mouth daily as needed.     albuterol (PROVENTIL HFA;VENTOLIN HFA) 108 (90 Base) MCG/ACT inhaler Inhale 2 puffs into the lungs every 6 (six) hours as needed for wheezing or shortness of breath. (Patient not taking: Reported on 02/13/2022)  1 Inhaler 2   No current facility-administered medications for this visit.     Allergies:   Patient has no known allergies.   Social History:  The patient  reports that she has never smoked. She has never used smokeless tobacco. She reports that she does not currently use alcohol. She reports that she does not use drugs.   Family History:   Family history is unknown by patient.    Review of Systems: Review of Systems  Constitutional: Negative.   HENT: Negative.    Respiratory: Negative.    Cardiovascular: Negative.   Gastrointestinal: Negative.   Musculoskeletal: Negative.   Neurological:  Positive for dizziness.  Psychiatric/Behavioral: Negative.    All other systems reviewed and are negative.   PHYSICAL EXAM: VS:  BP 110/80 (BP Location: Left Arm, Patient Position: Sitting, Cuff Size: Normal)    Pulse (!) 101    Ht 5\' 7"  (1.702 m)    Wt 211 lb 6 oz (95.9 kg)    SpO2 98%    BMI 33.11 kg/m  , BMI Body mass index is 33.11 kg/m. GEN: Well nourished, well developed, in no acute distress HEENT: normal Neck: no JVD, carotid bruits, or masses Cardiac: RRR; no murmurs, rubs, or gallops,no edema  Respiratory:  clear to auscultation bilaterally, normal work of breathing GI: soft, nontender, nondistended, + BS MS: no deformity or atrophy Skin: warm and dry, no rash Neuro:  Strength and sensation are intact Psych: euthymic  mood, full affect  Recent Labs: 06/22/2021: ALT 15; BUN 10; Creatinine, Ser 0.73; Hemoglobin 12.0; Platelets 178; Potassium 3.7; Sodium 136    Lipid Panel No results found for: CHOL, HDL, LDLCALC, TRIG    Wt Readings from Last 3 Encounters:  02/13/22 211 lb 6 oz (95.9 kg)  06/22/21 219 lb (99.3 kg)  02/01/19 212 lb (96.2 kg)       ASSESSMENT AND PLAN:  Problem List Items Addressed This Visit   None Visit Diagnoses     Dizziness    -  Primary   Relevant Orders   EKG 12-Lead   Pre-procedural cardiovascular examination       Class 1 obesity due  to excess calories without serious comorbidity with body mass index (BMI) of 33.0 to 33.9 in adult       Relevant Orders   EKG 12-Lead   Elevated glucose          Dizziness Symptoms consistent with vertigo Continue meclizine, if symptoms do not improve recommend she follow-up with ENT.  Could consider vestibular PT Contact phone number provided  "Valve leaking" Echocardiogram has been requested No significant murmur appreciated on exam No further work-up at this time, will review echocardiogram report when this becomes available.  Suspect has mild valve leakage which does not need further work-up.  No shortness of breath or chest discomfort  Obesity We have encouraged continued exercise, careful diet management in an effort to lose weight.  Elevated glucose Discussed, strict diet recommended    Total encounter time more than 50 minutes  Greater than 50% was spent in counseling and coordination of care with the patient    Signed, Dossie Arbour, M.D., Ph.D. [facility] [place], [place] [phone]
